# Patient Record
Sex: Female | Born: 1970 | Hispanic: Yes | Marital: Married | Smoking: Never smoker
Health system: Southern US, Community
[De-identification: ages and names within clinical notes are randomized; demographics above are authoritative.]

## PROBLEM LIST (undated history)

## (undated) DIAGNOSIS — K929 Disease of digestive system, unspecified: Secondary | ICD-10-CM

## (undated) DIAGNOSIS — J309 Allergic rhinitis, unspecified: Secondary | ICD-10-CM

## (undated) HISTORY — PX: NO PAST SURGERIES: SHX2092

## (undated) HISTORY — DX: Disease of digestive system, unspecified: K92.9

## (undated) HISTORY — DX: Allergic rhinitis, unspecified: J30.9

---

## 2012-10-26 ENCOUNTER — Other Ambulatory Visit: Payer: Self-pay | Admitting: Gastroenterology

## 2012-10-26 DIAGNOSIS — R109 Unspecified abdominal pain: Secondary | ICD-10-CM

## 2012-10-28 ENCOUNTER — Ambulatory Visit
Admission: RE | Admit: 2012-10-28 | Discharge: 2012-10-28 | Disposition: A | Discharge status: Home / Self Care | Payer: BC Managed Care – PPO | Source: Ambulatory Visit | Attending: Gastroenterology | Admitting: Gastroenterology

## 2012-10-28 DIAGNOSIS — R109 Unspecified abdominal pain: Secondary | ICD-10-CM

## 2016-01-16 DIAGNOSIS — J0191 Acute recurrent sinusitis, unspecified: Secondary | ICD-10-CM | POA: Diagnosis not present

## 2016-02-12 DIAGNOSIS — N760 Acute vaginitis: Secondary | ICD-10-CM | POA: Diagnosis not present

## 2016-02-12 DIAGNOSIS — B9689 Other specified bacterial agents as the cause of diseases classified elsewhere: Secondary | ICD-10-CM | POA: Diagnosis not present

## 2016-07-10 DIAGNOSIS — E611 Iron deficiency: Secondary | ICD-10-CM | POA: Diagnosis not present

## 2016-07-10 DIAGNOSIS — Z0000 Encounter for general adult medical examination without abnormal findings: Secondary | ICD-10-CM | POA: Diagnosis not present

## 2016-07-10 DIAGNOSIS — Z23 Encounter for immunization: Secondary | ICD-10-CM | POA: Diagnosis not present

## 2016-07-10 DIAGNOSIS — R739 Hyperglycemia, unspecified: Secondary | ICD-10-CM | POA: Diagnosis not present

## 2016-07-10 DIAGNOSIS — E559 Vitamin D deficiency, unspecified: Secondary | ICD-10-CM | POA: Diagnosis not present

## 2016-07-10 DIAGNOSIS — K219 Gastro-esophageal reflux disease without esophagitis: Secondary | ICD-10-CM | POA: Diagnosis not present

## 2016-10-14 DIAGNOSIS — Z1389 Encounter for screening for other disorder: Secondary | ICD-10-CM | POA: Diagnosis not present

## 2016-10-14 DIAGNOSIS — Z124 Encounter for screening for malignant neoplasm of cervix: Secondary | ICD-10-CM | POA: Diagnosis not present

## 2016-10-14 DIAGNOSIS — Z01419 Encounter for gynecological examination (general) (routine) without abnormal findings: Secondary | ICD-10-CM | POA: Diagnosis not present

## 2017-02-12 ENCOUNTER — Other Ambulatory Visit: Payer: Self-pay | Admitting: Obstetrics and Gynecology

## 2017-02-12 DIAGNOSIS — Z1231 Encounter for screening mammogram for malignant neoplasm of breast: Secondary | ICD-10-CM

## 2017-02-24 ENCOUNTER — Encounter: Payer: Self-pay | Admitting: Radiology

## 2017-02-24 ENCOUNTER — Ambulatory Visit
Admission: RE | Admit: 2017-02-24 | Discharge: 2017-02-24 | Disposition: A | Discharge status: Home / Self Care | Payer: BLUE CROSS/BLUE SHIELD | Source: Ambulatory Visit | Attending: Obstetrics and Gynecology | Admitting: Obstetrics and Gynecology

## 2017-02-24 DIAGNOSIS — Z1231 Encounter for screening mammogram for malignant neoplasm of breast: Secondary | ICD-10-CM | POA: Insufficient documentation

## 2017-02-24 DIAGNOSIS — R928 Other abnormal and inconclusive findings on diagnostic imaging of breast: Secondary | ICD-10-CM | POA: Diagnosis not present

## 2017-02-25 ENCOUNTER — Other Ambulatory Visit: Payer: Self-pay | Admitting: *Deleted

## 2017-02-25 ENCOUNTER — Inpatient Hospital Stay
Admission: RE | Admit: 2017-02-25 | Discharge: 2017-02-25 | Disposition: A | Discharge status: Home / Self Care | Payer: Self-pay | Source: Ambulatory Visit | Attending: *Deleted | Admitting: *Deleted

## 2017-02-25 DIAGNOSIS — Z9289 Personal history of other medical treatment: Secondary | ICD-10-CM

## 2017-03-01 ENCOUNTER — Other Ambulatory Visit: Payer: Self-pay | Admitting: Obstetrics and Gynecology

## 2017-03-01 DIAGNOSIS — R928 Other abnormal and inconclusive findings on diagnostic imaging of breast: Secondary | ICD-10-CM

## 2017-03-01 DIAGNOSIS — N6320 Unspecified lump in the left breast, unspecified quadrant: Secondary | ICD-10-CM

## 2017-03-04 ENCOUNTER — Ambulatory Visit
Admission: RE | Admit: 2017-03-04 | Discharge: 2017-03-04 | Disposition: A | Discharge status: Home / Self Care | Payer: BLUE CROSS/BLUE SHIELD | Source: Ambulatory Visit | Attending: Obstetrics and Gynecology | Admitting: Obstetrics and Gynecology

## 2017-03-04 DIAGNOSIS — N6489 Other specified disorders of breast: Secondary | ICD-10-CM | POA: Insufficient documentation

## 2017-03-04 DIAGNOSIS — N6320 Unspecified lump in the left breast, unspecified quadrant: Secondary | ICD-10-CM | POA: Diagnosis not present

## 2017-03-04 DIAGNOSIS — R928 Other abnormal and inconclusive findings on diagnostic imaging of breast: Secondary | ICD-10-CM

## 2017-03-04 DIAGNOSIS — N6002 Solitary cyst of left breast: Secondary | ICD-10-CM | POA: Insufficient documentation

## 2017-03-05 ENCOUNTER — Other Ambulatory Visit: Payer: Self-pay | Admitting: Obstetrics and Gynecology

## 2017-03-05 DIAGNOSIS — N6342 Unspecified lump in left breast, subareolar: Secondary | ICD-10-CM | POA: Insufficient documentation

## 2017-03-11 ENCOUNTER — Other Ambulatory Visit: Payer: Self-pay | Admitting: Obstetrics and Gynecology

## 2017-03-11 DIAGNOSIS — N6342 Unspecified lump in left breast, subareolar: Secondary | ICD-10-CM

## 2017-07-22 DIAGNOSIS — E559 Vitamin D deficiency, unspecified: Secondary | ICD-10-CM | POA: Diagnosis not present

## 2017-07-22 DIAGNOSIS — Z136 Encounter for screening for cardiovascular disorders: Secondary | ICD-10-CM | POA: Diagnosis not present

## 2017-07-22 DIAGNOSIS — K219 Gastro-esophageal reflux disease without esophagitis: Secondary | ICD-10-CM | POA: Diagnosis not present

## 2017-07-22 DIAGNOSIS — Z79899 Other long term (current) drug therapy: Secondary | ICD-10-CM | POA: Diagnosis not present

## 2017-07-22 DIAGNOSIS — J069 Acute upper respiratory infection, unspecified: Secondary | ICD-10-CM | POA: Diagnosis not present

## 2017-07-22 DIAGNOSIS — E611 Iron deficiency: Secondary | ICD-10-CM | POA: Diagnosis not present

## 2017-07-22 DIAGNOSIS — Z0000 Encounter for general adult medical examination without abnormal findings: Secondary | ICD-10-CM | POA: Diagnosis not present

## 2017-07-22 DIAGNOSIS — R739 Hyperglycemia, unspecified: Secondary | ICD-10-CM | POA: Diagnosis not present

## 2017-09-06 ENCOUNTER — Ambulatory Visit
Admission: RE | Admit: 2017-09-06 | Discharge: 2017-09-06 | Disposition: A | Discharge status: Home / Self Care | Payer: BLUE CROSS/BLUE SHIELD | Source: Ambulatory Visit | Attending: Obstetrics and Gynecology | Admitting: Obstetrics and Gynecology

## 2017-09-06 DIAGNOSIS — N6342 Unspecified lump in left breast, subareolar: Secondary | ICD-10-CM

## 2017-09-06 DIAGNOSIS — R928 Other abnormal and inconclusive findings on diagnostic imaging of breast: Secondary | ICD-10-CM | POA: Diagnosis not present

## 2017-09-06 DIAGNOSIS — N6489 Other specified disorders of breast: Secondary | ICD-10-CM | POA: Diagnosis not present

## 2017-09-13 DIAGNOSIS — T1511XA Foreign body in conjunctival sac, right eye, initial encounter: Secondary | ICD-10-CM | POA: Diagnosis not present

## 2017-11-16 DIAGNOSIS — K219 Gastro-esophageal reflux disease without esophagitis: Secondary | ICD-10-CM | POA: Diagnosis not present

## 2018-05-25 ENCOUNTER — Other Ambulatory Visit: Payer: Self-pay | Admitting: Internal Medicine

## 2018-05-25 DIAGNOSIS — Z1231 Encounter for screening mammogram for malignant neoplasm of breast: Secondary | ICD-10-CM

## 2018-06-15 ENCOUNTER — Ambulatory Visit
Admission: RE | Admit: 2018-06-15 | Discharge: 2018-06-15 | Disposition: A | Discharge status: Home / Self Care | Payer: BLUE CROSS/BLUE SHIELD | Source: Ambulatory Visit | Attending: Internal Medicine | Admitting: Internal Medicine

## 2018-06-15 DIAGNOSIS — Z1231 Encounter for screening mammogram for malignant neoplasm of breast: Secondary | ICD-10-CM | POA: Diagnosis not present

## 2019-06-16 ENCOUNTER — Other Ambulatory Visit: Payer: Self-pay | Admitting: Internal Medicine

## 2019-06-16 DIAGNOSIS — Z1231 Encounter for screening mammogram for malignant neoplasm of breast: Secondary | ICD-10-CM

## 2019-07-25 ENCOUNTER — Ambulatory Visit
Admission: RE | Admit: 2019-07-25 | Discharge: 2019-07-25 | Disposition: A | Discharge status: Home / Self Care | Payer: 59 | Source: Ambulatory Visit | Attending: Internal Medicine | Admitting: Internal Medicine

## 2019-07-25 DIAGNOSIS — Z1231 Encounter for screening mammogram for malignant neoplasm of breast: Secondary | ICD-10-CM | POA: Diagnosis present

## 2019-09-27 ENCOUNTER — Other Ambulatory Visit: Payer: Self-pay

## 2019-09-27 ENCOUNTER — Encounter: Payer: Self-pay | Admitting: Obstetrics and Gynecology

## 2019-09-27 ENCOUNTER — Ambulatory Visit (INDEPENDENT_AMBULATORY_CARE_PROVIDER_SITE_OTHER): Payer: Managed Care, Other (non HMO) | Admitting: Obstetrics and Gynecology

## 2019-09-27 VITALS — BP 120/80 | Ht 64.0 in | Wt 148.0 lb

## 2019-09-27 DIAGNOSIS — N760 Acute vaginitis: Secondary | ICD-10-CM | POA: Diagnosis not present

## 2019-09-27 DIAGNOSIS — N898 Other specified noninflammatory disorders of vagina: Secondary | ICD-10-CM | POA: Diagnosis not present

## 2019-09-27 DIAGNOSIS — B9689 Other specified bacterial agents as the cause of diseases classified elsewhere: Secondary | ICD-10-CM | POA: Diagnosis not present

## 2019-09-27 LAB — POCT WET PREP WITH KOH
Clue Cells Wet Prep HPF POC: NEGATIVE
KOH Prep POC: NEGATIVE
Trichomonas, UA: NEGATIVE
Yeast Wet Prep HPF POC: NEGATIVE

## 2019-09-27 MED ORDER — METRONIDAZOLE 0.75 % VA GEL: 1.0000 | Freq: Every day | VAGINAL | 0 refills | Status: AC

## 2019-09-27 NOTE — Patient Instructions (Signed)
I value your feedback and entrusting us with your care. If you get a [facility] patient survey, I would appreciate you taking the time to let us know about your experience today. Thank you!  As of September 21, 2019, your lab results will be released to your MyChart immediately, before I even have a chance to see them. Please give me time to review them and contact you if there are any abnormalities. Thank you for your patience.  

## 2019-09-27 NOTE — Progress Notes (Signed)
Patient, No Pcp Per   Chief Complaint  Patient presents with  . Vaginal Discharge    abnormal odor, itching and little irritation x weeks    HPI:      [name] is a 48 y.o. No obstetric history on file. who LMP was No LMP recorded. Patient is postmenopausal., presents today for increased vaginal d/c with non-fishy odor, irritation, mild pelvic discomfort for a few wks. Hx of BV in past and used leftover cream to treat. Sx feel the same. Did abx a few months ago. No urin sx, fevers.  She is not sex active. No longer having menses.  Last pap 2018.  Patient Active Problem List   Diagnosis Date Noted  . Bacterial vaginosis 09/27/2019  . Subareolar mass of left breast 03/05/2017    History reviewed. No pertinent surgical history.  Family History  Problem Relation Age of Onset  . Diabetes Mother   . Heart disease Father   . Hypertension Father   . Transient ischemic attack Father   . Breast cancer Neg Hx     Social History   Socioeconomic History  . Marital status: Married    Spouse name: Not on file  . Number of children: Not on file  . Years of education: Not on file  . Highest education level: Not on file  Occupational History  . Not on file  Tobacco Use  . Smoking status: Never Smoker  . Smokeless tobacco: Never Used  Substance and Sexual Activity  . Alcohol use: Not Currently  . Drug use: Never  . Sexual activity: Not Currently    Birth control/protection: None  Other Topics Concern  . Not on file  Social History Narrative  . Not on file   Social Determinants of Health   Financial Resource Strain:   . Difficulty of Paying Living Expenses: Not on file  Food Insecurity:   . Worried About Charity fundraiser in the Last Year: Not on file  . Ran Out of Food in the Last Year: Not on file  Transportation Needs:   . Lack of Transportation (Medical): Not on file  . Lack of Transportation (Non-Medical): Not on file  Physical Activity:   . Days of  Exercise per Week: Not on file  . Minutes of Exercise per Session: Not on file  Stress:   . Feeling of Stress : Not on file  Social Connections:   . Frequency of Communication with Friends and Family: Not on file  . Frequency of Social Gatherings with Friends and Family: Not on file  . Attends Religious Services: Not on file  . Active Member of Clubs or Organizations: Not on file  . Attends Archivist Meetings: Not on file  . Marital Status: Not on file  Intimate Partner Violence:   . Fear of Current or Ex-Partner: Not on file  . Emotionally Abused: Not on file  . Physically Abused: Not on file  . Sexually Abused: Not on file    Outpatient Medications Prior to Visit  Medication Sig Dispense Refill  . pantoprazole (PROTONIX) 40 MG tablet      No facility-administered medications prior to visit.      ROS:  Review of Systems  Constitutional: Negative for fever.  Gastrointestinal: Negative for blood in stool, constipation, diarrhea, nausea and vomiting.  Genitourinary: Positive for pelvic pain and vaginal discharge. Negative for dyspareunia, dysuria, flank pain, frequency, hematuria, urgency, vaginal bleeding and vaginal pain.  Musculoskeletal: Negative for  back pain.  Skin: Negative for rash.  BREAST: No symptoms   OBJECTIVE:   Vitals:  BP 120/80   Ht 5\' 4"  (1.626 m)   Wt 148 lb (67.1 kg)   BMI 25.40 kg/m   Physical Exam Vitals reviewed.  Constitutional:      Appearance: She is well-developed.  Pulmonary:     Effort: Pulmonary effort is normal.  Genitourinary:    General: Normal vulva.     Pubic Area: No rash.      Labia:        Right: No rash, tenderness or lesion.        Left: No rash, tenderness or lesion.      Vagina: Normal. No vaginal discharge, erythema or tenderness.     Cervix: Normal.     Uterus: Normal. Not enlarged and not tender.      Adnexa: Right adnexa normal and left adnexa normal.       Right: No mass or tenderness.          Left: No mass or tenderness.    Musculoskeletal:        General: Normal range of motion.     Cervical back: Normal range of motion.  Skin:    General: Skin is warm and dry.  Neurological:     General: No focal deficit present.     Mental Status: She is alert and oriented to person, place, and time.  Psychiatric:        Mood and Affect: Mood normal.        Behavior: Behavior normal.        Thought Content: Thought content normal.        Judgment: Judgment normal.     Results: Results for orders placed or performed in visit on 09/27/19 (from the past 24 hour(s))  POCT Wet Prep with KOH     Status: Normal   Collection Time: 09/27/19 12:07 PM  Result Value Ref Range   Trichomonas, UA Negative    Clue Cells Wet Prep HPF POC neg    Epithelial Wet Prep HPF POC     Yeast Wet Prep HPF POC neg    Bacteria Wet Prep HPF POC     RBC Wet Prep HPF POC     WBC Wet Prep HPF POC     KOH Prep POC Negative Negative     Assessment/Plan: Vaginal discharge - Plan: POCT Wet Prep with KOH; neg wet prep/pos sx. Already treated with leftover metrogel. Most likely BV by clinical sx/hx. Treat empirically with Rx metrogel. Will RF if sx recur. If sx persist, will check culture. F/u prn.  Bacterial vaginosis - Plan: metroNIDAZOLE (METROGEL) 0.75 % vaginal gel    Meds ordered this encounter  Medications  . metroNIDAZOLE (METROGEL) 0.75 % vaginal gel    Sig: Place 1 Applicatorful vaginally at bedtime for 5 days.    Dispense:  50 g    Refill:  0    Order Specific Question:   Supervising Provider    Answer:   09/29/19 Nadara Mustard      Return if symptoms worsen or fail to improve.  Anna Beaird B. Freyja Govea, PA-C 09/27/2019 12:09 PM

## 2019-11-09 DIAGNOSIS — Z13220 Encounter for screening for lipoid disorders: Secondary | ICD-10-CM | POA: Diagnosis not present

## 2019-11-09 DIAGNOSIS — K219 Gastro-esophageal reflux disease without esophagitis: Secondary | ICD-10-CM | POA: Diagnosis not present

## 2019-11-09 DIAGNOSIS — Z0000 Encounter for general adult medical examination without abnormal findings: Secondary | ICD-10-CM | POA: Diagnosis not present

## 2019-11-09 DIAGNOSIS — Z79899 Other long term (current) drug therapy: Secondary | ICD-10-CM | POA: Diagnosis not present

## 2019-11-09 DIAGNOSIS — E559 Vitamin D deficiency, unspecified: Secondary | ICD-10-CM | POA: Diagnosis not present

## 2019-11-09 DIAGNOSIS — E611 Iron deficiency: Secondary | ICD-10-CM | POA: Diagnosis not present

## 2019-11-09 DIAGNOSIS — R739 Hyperglycemia, unspecified: Secondary | ICD-10-CM | POA: Diagnosis not present

## 2019-12-01 DIAGNOSIS — R140 Abdominal distension (gaseous): Secondary | ICD-10-CM | POA: Diagnosis not present

## 2019-12-01 DIAGNOSIS — K219 Gastro-esophageal reflux disease without esophagitis: Secondary | ICD-10-CM | POA: Diagnosis not present

## 2019-12-28 DIAGNOSIS — L282 Other prurigo: Secondary | ICD-10-CM | POA: Diagnosis not present

## 2019-12-28 DIAGNOSIS — Z23 Encounter for immunization: Secondary | ICD-10-CM | POA: Diagnosis not present

## 2020-11-27 ENCOUNTER — Other Ambulatory Visit: Payer: Self-pay | Admitting: Internal Medicine

## 2020-11-27 DIAGNOSIS — Z1231 Encounter for screening mammogram for malignant neoplasm of breast: Secondary | ICD-10-CM

## 2020-12-13 ENCOUNTER — Other Ambulatory Visit: Payer: Self-pay

## 2020-12-13 ENCOUNTER — Ambulatory Visit
Admission: RE | Admit: 2020-12-13 | Discharge: 2020-12-13 | Disposition: A | Discharge status: Home / Self Care | Payer: 59 | Source: Ambulatory Visit | Attending: Internal Medicine | Admitting: Internal Medicine

## 2020-12-13 DIAGNOSIS — Z1231 Encounter for screening mammogram for malignant neoplasm of breast: Secondary | ICD-10-CM

## 2020-12-17 ENCOUNTER — Other Ambulatory Visit: Payer: Self-pay | Admitting: Internal Medicine

## 2020-12-19 ENCOUNTER — Other Ambulatory Visit: Payer: Self-pay | Admitting: Internal Medicine

## 2020-12-19 DIAGNOSIS — N6489 Other specified disorders of breast: Secondary | ICD-10-CM

## 2020-12-19 DIAGNOSIS — R928 Other abnormal and inconclusive findings on diagnostic imaging of breast: Secondary | ICD-10-CM

## 2020-12-25 ENCOUNTER — Ambulatory Visit
Admission: RE | Admit: 2020-12-25 | Discharge: 2020-12-25 | Disposition: A | Discharge status: Home / Self Care | Payer: 59 | Source: Ambulatory Visit | Attending: Internal Medicine | Admitting: Internal Medicine

## 2020-12-25 ENCOUNTER — Other Ambulatory Visit: Payer: Self-pay

## 2020-12-25 DIAGNOSIS — R928 Other abnormal and inconclusive findings on diagnostic imaging of breast: Secondary | ICD-10-CM

## 2020-12-25 DIAGNOSIS — N6489 Other specified disorders of breast: Secondary | ICD-10-CM | POA: Diagnosis present

## 2021-03-24 ENCOUNTER — Ambulatory Visit (INDEPENDENT_AMBULATORY_CARE_PROVIDER_SITE_OTHER): Payer: 59 | Admitting: Obstetrics and Gynecology

## 2021-03-24 ENCOUNTER — Other Ambulatory Visit: Payer: Self-pay

## 2021-03-24 ENCOUNTER — Encounter: Payer: Self-pay | Admitting: Obstetrics and Gynecology

## 2021-03-24 ENCOUNTER — Other Ambulatory Visit (HOSPITAL_COMMUNITY)
Admission: RE | Admit: 2021-03-24 | Discharge: 2021-03-24 | Disposition: A | Discharge status: Home / Self Care | Payer: 59 | Source: Ambulatory Visit | Attending: Obstetrics and Gynecology | Admitting: Obstetrics and Gynecology

## 2021-03-24 VITALS — BP 120/80 | Ht 65.0 in | Wt 142.0 lb

## 2021-03-24 DIAGNOSIS — Z124 Encounter for screening for malignant neoplasm of cervix: Secondary | ICD-10-CM | POA: Diagnosis present

## 2021-03-24 DIAGNOSIS — Z1339 Encounter for screening examination for other mental health and behavioral disorders: Secondary | ICD-10-CM

## 2021-03-24 DIAGNOSIS — Z01419 Encounter for gynecological examination (general) (routine) without abnormal findings: Secondary | ICD-10-CM

## 2021-03-24 DIAGNOSIS — R011 Cardiac murmur, unspecified: Secondary | ICD-10-CM

## 2021-03-24 DIAGNOSIS — Z1331 Encounter for screening for depression: Secondary | ICD-10-CM

## 2021-03-24 NOTE — Progress Notes (Signed)
Gynecology Annual Exam  PCP: Marden Noble, MD  Chief Complaint  Patient presents with   Gynecologic Exam    No concerns   History of Present Illness:  Veronica Mahoney is a 50 y.o. [phone] who LMP was No LMP recorded. Patient is postmenopausal., presents today for her annual examination.  She doesn't remember the last time she has period was at age 50 years.    She does not have vasomotor sx.   She is not sexually active.   Last Pap: 2018  Results were: no abnormalities /neg HPV DNA.  Hx of STDs: none  Last mammogram: 2022  Results were: BiRads 2 - follow up 1 year There is no FH of breast cancer. There is no FH of ovarian cancer. The patient does not do self-breast exams.  Colonoscopy: never had DEXA: has not been screened for osteoporosis  Tobacco use: The patient denies current or previous tobacco use. Alcohol use: social drinker Exercise: yes  The patient wears seatbelts: yes.     Past Medical History:  Diagnosis Date   Allergic rhinitis    Gastrointestinal disorder     Past Surgical History:  Procedure Laterality Date   NO PAST SURGERIES      Prior to Admission medications   Medication Sig Start Date End Date Taking? Authorizing Provider  hydrocortisone 2.5 % cream Apply topically 2 (two) times daily. 03/05/21  Yes [provider]  pantoprazole (PROTONIX) 40 MG tablet  07/03/14  Yes [provider]  VITAMIN D PO Take by mouth.   Yes [provider]    Allergies  Allergen Reactions   Other Swelling    Seafood- shortness of breath   Penicillins Rash   Sulfa Antibiotics Rash    Obstetric History: [id number]  Family History  Problem Relation Age of Onset   Diabetes Mother    Heart disease Father    Hypertension Father    Transient ischemic attack Father    Breast cancer Neg Hx     Social History   Socioeconomic History   Marital status: Married    Spouse name: Not on file   Number of children: Not on file   Years of  education: Not on file   Highest education level: Not on file  Occupational History   Not on file  Tobacco Use   Smoking status: Never   Smokeless tobacco: Never  Vaping Use   Vaping Use: Never used  Substance and Sexual Activity   Alcohol use: Not Currently   Drug use: Never   Sexual activity: Not Currently    Birth control/protection: None  Other Topics Concern   Not on file  Social History Narrative   Not on file   Social Determinants of Health   Financial Resource Strain: Not on file  Food Insecurity: Not on file  Transportation Needs: Not on file  Physical Activity: Not on file  Stress: Not on file  Social Connections: Not on file  Intimate Partner Violence: Not on file    Review of Systems  Constitutional: Negative.   HENT: Negative.    Eyes: Negative.   Respiratory: Negative.    Cardiovascular: Negative.   Gastrointestinal: Negative.   Genitourinary: Negative.   Musculoskeletal: Negative.   Skin: Negative.   Neurological: Negative.   Psychiatric/Behavioral: Negative.      Physical Exam BP 120/80   Ht 5\' 5"  (1.651 m)   Wt 142 lb (64.4 kg)   BMI 23.63 kg/m   Physical Exam Constitutional:  General: She is not in acute distress.    Appearance: Normal appearance. She is well-developed.  Genitourinary:     Vulva and bladder normal.     Right Labia: No rash, tenderness, lesions, skin changes or Bartholin's cyst.    Left Labia: No tenderness, lesions, skin changes, Bartholin's cyst or rash.    No inguinal adenopathy present in the right or left side.    Pelvic Tanner Score: 5/5.    No vaginal discharge, erythema, tenderness or bleeding.      Right Adnexa: not tender, not full and no mass present.    Left Adnexa: not tender, not full and no mass present.    No cervical motion tenderness, discharge, lesion or polyp.     Uterus is not enlarged or tender.     No uterine mass detected.    Pelvic exam was performed with patient in the lithotomy position.   Breasts:    Right: No inverted nipple, mass, nipple discharge, skin change or tenderness.     Left: No inverted nipple, mass, nipple discharge, skin change or tenderness.  HENT:     Head: Normocephalic and atraumatic.  Eyes:     General: No scleral icterus.    Conjunctiva/sclera: Conjunctivae normal.  Neck:     Thyroid: No thyromegaly.  Cardiovascular:     Rate and Rhythm: Normal rate and regular rhythm.     Heart sounds: Murmur (SEM II/VI over 2nd right interspace) heard.    No friction rub. No gallop.  Pulmonary:     Effort: Pulmonary effort is normal. No respiratory distress.     Breath sounds: Normal breath sounds. No wheezing or rales.  Abdominal:     General: Bowel sounds are normal. There is no distension.     Palpations: Abdomen is soft. There is no mass.     Tenderness: There is no abdominal tenderness. There is no guarding or rebound.     Hernia: There is no hernia in the left inguinal area or right inguinal area.  Musculoskeletal:        General: No swelling or tenderness. Normal range of motion.     Cervical back: Normal range of motion and neck supple.  Lymphadenopathy:     Cervical: No cervical adenopathy.     Lower Body: No right inguinal adenopathy. No left inguinal adenopathy.  Neurological:     General: No focal deficit present.     Mental Status: She is alert and oriented to person, place, and time.     Cranial Nerves: No cranial nerve deficit.  Skin:    General: Skin is warm and dry.     Findings: No erythema or rash.  Psychiatric:        Mood and Affect: Mood normal.        Behavior: Behavior normal.        Judgment: Judgment normal.    Female chaperone present for pelvic and breast  portions of the physical exam  Results: AUDIT Questionnaire (screen for alcoholism): 3 PHQ-9: 0  Assessment: 50 y.o. [id number] female here for routine gynecologic examination.  Plan: Problem List Items Addressed This Visit   None Visit Diagnoses     Women's  annual routine gynecological examination    -  Primary   Relevant Orders   Cytology - PAP   Screening for depression       Screening for alcoholism       Pap smear for cervical cancer screening       Relevant  Orders   Cytology - PAP   Heart murmur           Screening: -- Blood pressure screen normal -- Colonoscopy -  due. Needs to discuss with her new PCP -- Mammogram - not due -- Weight screening: normal -- Depression screening negative (PHQ-9) -- Nutrition: normal -- cholesterol screening: per PCP -- osteoporosis screening: not due -- tobacco screening: not using -- alcohol screening: AUDIT questionnaire indicates low-risk usage. -- family history of breast cancer screening: done. not at high risk. -- no evidence of domestic violence or intimate partner violence. -- STD screening: gonorrhea/chlamydia NAAT not collected per patient request. -- pap smear collected per ASCCP guidelines  Heart murmur: I asked that she discuss with her PCP (She is moving to [place], [place], in a couple weeks).  It is a very mild heart murmur and she is asymptomatic.   Thomasene Mohair, MD 03/24/2021 2:09 PM

## 2021-03-25 ENCOUNTER — Encounter: Payer: Self-pay | Admitting: Obstetrics and Gynecology

## 2021-03-28 LAB — CYTOLOGY - PAP
Comment: NEGATIVE
Comment: NEGATIVE
Diagnosis: HIGH — AB
HPV 16: POSITIVE — AB
HPV 18 / 45: NEGATIVE
High risk HPV: POSITIVE — AB

## 2021-04-15 ENCOUNTER — Telehealth: Payer: Self-pay | Admitting: Obstetrics and Gynecology

## 2021-04-15 DIAGNOSIS — R87613 High grade squamous intraepithelial lesion on cytologic smear of cervix (HGSIL): Secondary | ICD-10-CM

## 2021-04-15 NOTE — Telephone Encounter (Signed)
[employer] call: Reviewed pap smear results with a medical spanish interpreter.  Discussed high grade pre-cancerous lesion.  Options include colposcopy vs LEEP.  She elects LEEP.   All questions answered. Will schedule LEEP.

## 2021-04-18 ENCOUNTER — Telehealth: Payer: Self-pay

## 2021-04-18 NOTE — Telephone Encounter (Signed)
Called patient with [facility] interpreters [name] [phone]  She has been scheduled for an in office LEEP procedure with Jean Rosenthal on 8/4 @ 8:50. Advised to arr 15 min prior to appt.

## 2021-04-18 NOTE — Telephone Encounter (Signed)
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

## 2021-04-22 NOTE — Telephone Encounter (Signed)
Noted  

## 2021-05-15 ENCOUNTER — Other Ambulatory Visit (HOSPITAL_COMMUNITY)
Admission: RE | Admit: 2021-05-15 | Discharge: 2021-05-15 | Disposition: A | Discharge status: Home / Self Care | Payer: 59 | Source: Ambulatory Visit | Attending: Obstetrics and Gynecology | Admitting: Obstetrics and Gynecology

## 2021-05-15 ENCOUNTER — Other Ambulatory Visit: Payer: Self-pay

## 2021-05-15 ENCOUNTER — Ambulatory Visit (INDEPENDENT_AMBULATORY_CARE_PROVIDER_SITE_OTHER): Payer: 59 | Admitting: Obstetrics and Gynecology

## 2021-05-15 ENCOUNTER — Encounter: Payer: Self-pay | Admitting: Obstetrics and Gynecology

## 2021-05-15 VITALS — BP 128/80 | HR 82 | Ht 65.0 in | Wt 139.0 lb

## 2021-05-15 DIAGNOSIS — R87613 High grade squamous intraepithelial lesion on cytologic smear of cervix (HGSIL): Secondary | ICD-10-CM | POA: Diagnosis present

## 2021-05-15 NOTE — Progress Notes (Signed)
  LEEP PROCEDURE NOTE  The LEEP has been explained to the patient in detail; risks/benefits reviewed.  The risks include, but are not limited to, bleeding, infection, and the possibility of cervical stenosis or cervical incompetence.  The patient had previously been given information regarding abnormal PAP smears and their relationship to HPV.  We have discussed the natural course and history of HPV, the possibility of incomplete treatment by the LEEP, as well as the possibility of recurrence.  I have reviewed the consent form for LEEP with her, and she fully understands its contents.  We have discussed the procedure itself. I have informed her that following the LEEP she should refrain from intercourse and the use of tampons for three weeks, and that she should also expect some spotting and brown/black discharge over the next several days.  We have discussed the fact that vaginal bleeding, differentiated from spotting, is not normal and that if she should have this complication, she should contact me immediately.  The follow-up after LEEP will be PAP smears or viral typing performed at regular intervals for up to 3-5 years.  Should these all prove to be normal, she will then be back on typical cervical screening.  I have answered all of her questions, and I believe she has an adequate understanding of the LEEP, its implications, and the necessity of follow-up care.  I discussed her colpo results and explained the procedure of LEEP.  All questions were answered and she signed the consent form.    LEEP performed in the usual manner after reviewing the previous colpo findings and results. The colposcope was used to gain better visualization of the cervix throughout the procedure and was necessary to gain access to margins.  Lugol's solution was usesd to identify any abnormal areas of the cervix.  The cervix was cleansed with betadine solution. Local injection of Lidocaine was performed for  anesthesia. Ectocervical and then endocervical specimens obtained using the loop electrodes without difficulty.  It was labeled accordingly. An ECC was obtained.  The base and edges of the defect was then cauterized using coagulation current. Monsel's solution was applied to ensure ongoing hemostasis.   The patient tolerated the procedure well.   Thomasene Mohair, MD 05/15/2021 10:09 AM

## 2021-05-20 LAB — SURGICAL PATHOLOGY

## 2021-06-13 ENCOUNTER — Encounter: Payer: Self-pay | Admitting: Obstetrics and Gynecology

## 2021-06-13 ENCOUNTER — Other Ambulatory Visit: Payer: Self-pay

## 2021-06-13 ENCOUNTER — Ambulatory Visit (INDEPENDENT_AMBULATORY_CARE_PROVIDER_SITE_OTHER): Payer: 59 | Admitting: Obstetrics and Gynecology

## 2021-06-13 VITALS — BP 122/70 | Ht 64.0 in | Wt 136.0 lb

## 2021-06-13 DIAGNOSIS — Z09 Encounter for follow-up examination after completed treatment for conditions other than malignant neoplasm: Secondary | ICD-10-CM

## 2021-06-13 DIAGNOSIS — R87613 High grade squamous intraepithelial lesion on cytologic smear of cervix (HGSIL): Secondary | ICD-10-CM

## 2021-06-13 DIAGNOSIS — N871 Moderate cervical dysplasia: Secondary | ICD-10-CM

## 2021-06-13 NOTE — Progress Notes (Signed)
   Postoperative Follow-up Patient presents post op from LEEP 4 weeks ago for HSIL  Subjective: Patient reports some improvement in her preop symptoms. Eating a regular diet without difficulty. The patient is not having any pain.  Activity: normal activities of daily living.  She denies fever, chills, nausea, and vomiting. She has has some prolonged bleeding. But, no cramps or clots.   FINAL MICROSCOPIC DIAGNOSIS:   A. ECTOCERVIX, LEEP:  - Low-grade and high-grade squamous intraepithelial lesion, CIN-1 and  CIN-2.  - Margins not involved.   B. ENDOCERVIX, LEEP:  - Benign endocervical mucosa.  - No dysplasia or malignancy.   C. ENDOCERVIX, CURETTAGE:  - Minute detached dysplastic squamous fragment.  - Scanty benign endocervical mucosa.  - See comment.   Objective: Vital Signs: BP 122/70   Ht 5\' 4"  (1.626 m)   Wt 136 lb (61.7 kg)   BMI 23.34 kg/m  Physical Exam Constitutional:      General: She is not in acute distress.    Appearance: Normal appearance.  Genitourinary:     Bladder and urethral meatus normal.     No lesions in the vagina.     Right Labia: No rash, tenderness, lesions or skin changes.    Left Labia: No tenderness, lesions, skin changes or rash.    No inguinal adenopathy present in the right or left side.    Pelvic Tanner Score: 5/5.    No vaginal discharge, erythema, bleeding or ulceration.     No vaginal prolapse present.     Right Adnexa: not tender, not full and no mass present.    Left Adnexa: not tender, not full and no mass present.    No cervical motion tenderness, discharge, friability, lesion or polyp.     Cervical exam comments: Mild stenosis of cervix. Otherwise, well healed.     Uterus is not enlarged, fixed or tender.     Uterus is anteverted.  HENT:     Head: Normocephalic and atraumatic.  Eyes:     General: No scleral icterus.    Conjunctiva/sclera: Conjunctivae normal.  Lymphadenopathy:     Lower Body: No right inguinal  adenopathy. No left inguinal adenopathy.  Neurological:     General: No focal deficit present.     Mental Status: She is alert and oriented to person, place, and time.     Cranial Nerves: No cranial nerve deficit.  Psychiatric:        Mood and Affect: Mood normal.        Behavior: Behavior normal.        Judgment: Judgment normal.     Assessment: 50 y.o. s/p LEEP progressing well  Plan: Patient has done well after surgery with no apparent complications.  I have discussed the post-operative course to date, and the expected progress moving forward.  The patient understands what complications to be concerned about.  I will see the patient in routine follow up, or sooner if needed.    Activity plan: No restriction.  Discussed cervical stenosis. If bleeding remains a problem, can try to dilate cervix.  Recommend follow up pap smear in six months, given her results.   44, MD  06/13/2021, 10:19 AM

## 2021-10-17 DIAGNOSIS — Z03818 Encounter for observation for suspected exposure to other biological agents ruled out: Secondary | ICD-10-CM | POA: Diagnosis not present

## 2021-10-17 DIAGNOSIS — Z20822 Contact with and (suspected) exposure to covid-19: Secondary | ICD-10-CM | POA: Diagnosis not present

## 2021-10-17 DIAGNOSIS — J029 Acute pharyngitis, unspecified: Secondary | ICD-10-CM | POA: Diagnosis not present

## 2021-12-01 DIAGNOSIS — L299 Pruritus, unspecified: Secondary | ICD-10-CM | POA: Diagnosis not present

## 2021-12-01 DIAGNOSIS — T7802XA Anaphylactic reaction due to shellfish (crustaceans), initial encounter: Secondary | ICD-10-CM | POA: Diagnosis not present

## 2021-12-01 DIAGNOSIS — T781XXA Other adverse food reactions, not elsewhere classified, initial encounter: Secondary | ICD-10-CM | POA: Diagnosis not present

## 2021-12-01 DIAGNOSIS — L539 Erythematous condition, unspecified: Secondary | ICD-10-CM | POA: Diagnosis not present

## 2021-12-01 DIAGNOSIS — X58XXXA Exposure to other specified factors, initial encounter: Secondary | ICD-10-CM | POA: Diagnosis not present

## 2021-12-01 DIAGNOSIS — T782XXA Anaphylactic shock, unspecified, initial encounter: Secondary | ICD-10-CM | POA: Diagnosis not present

## 2021-12-02 DIAGNOSIS — T782XXA Anaphylactic shock, unspecified, initial encounter: Secondary | ICD-10-CM | POA: Diagnosis not present

## 2021-12-20 IMAGING — MG DIGITAL DIAGNOSTIC BILAT W/ TOMO W/ CAD
8 series · 8 of 24 positions shown · non-contrast
Comparison: Previous exams including recent screening mammogram
dated 12/13/2020.

CLINICAL DATA: Patient returns today to evaluate possible bilateral
breast asymmetries identified on recent screening mammogram.



[R MLO synth-2D]
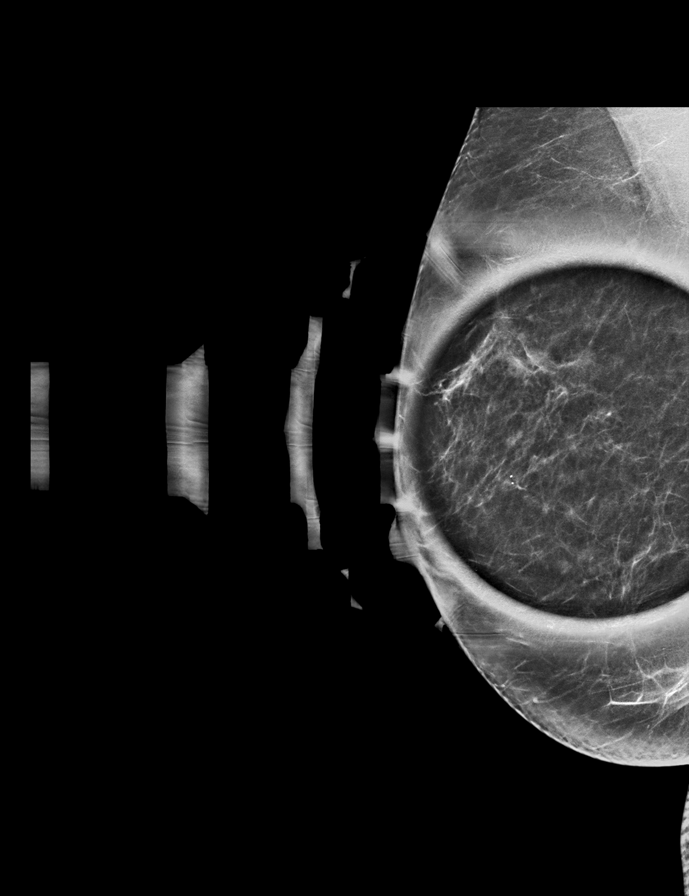

[L MLO synth-2D]
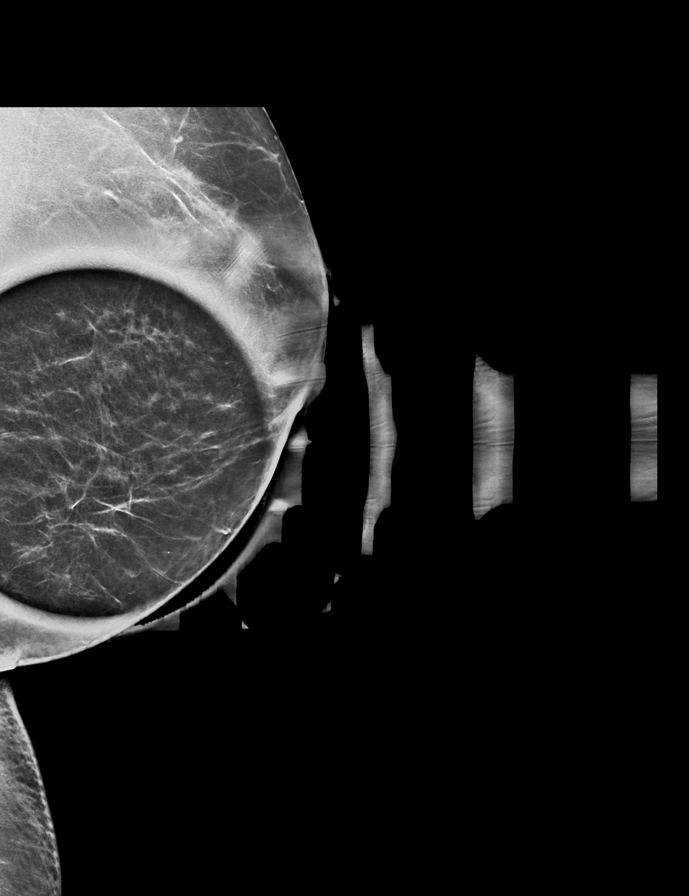

[R ML synth-2D]
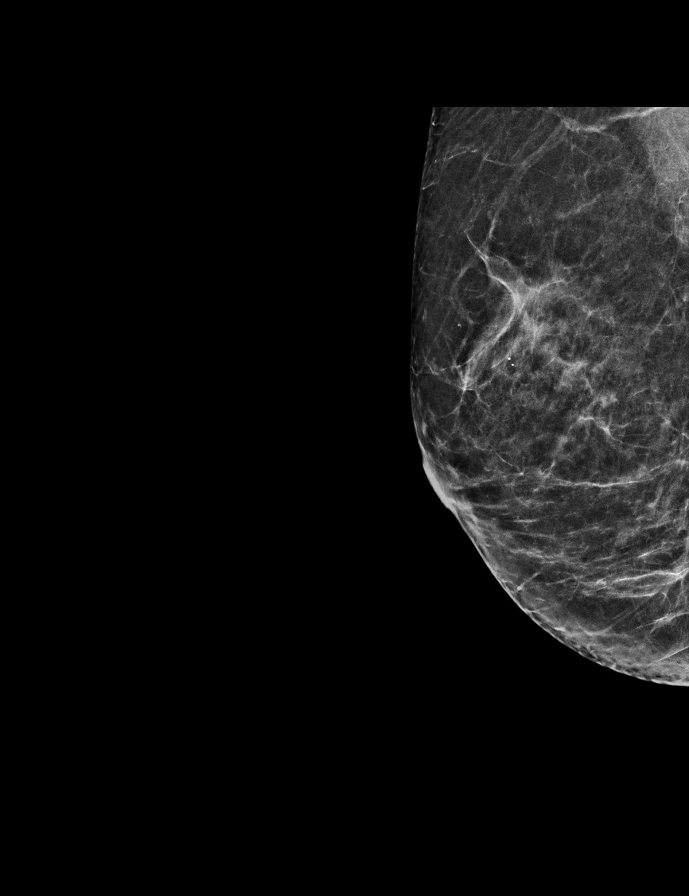

[L ML synth-2D]
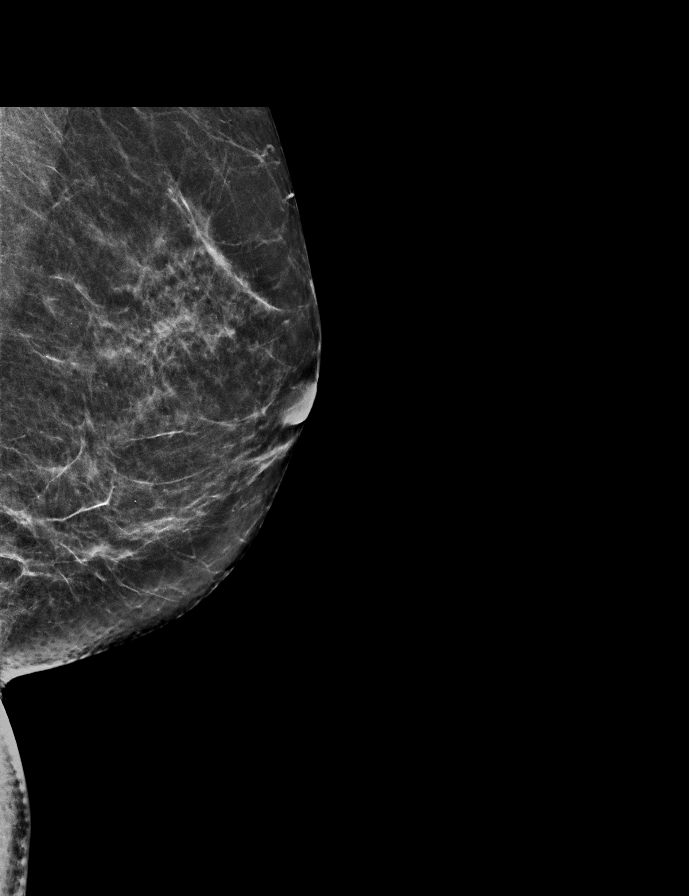

[R MLO tomo · tomo slice 30/59.0]
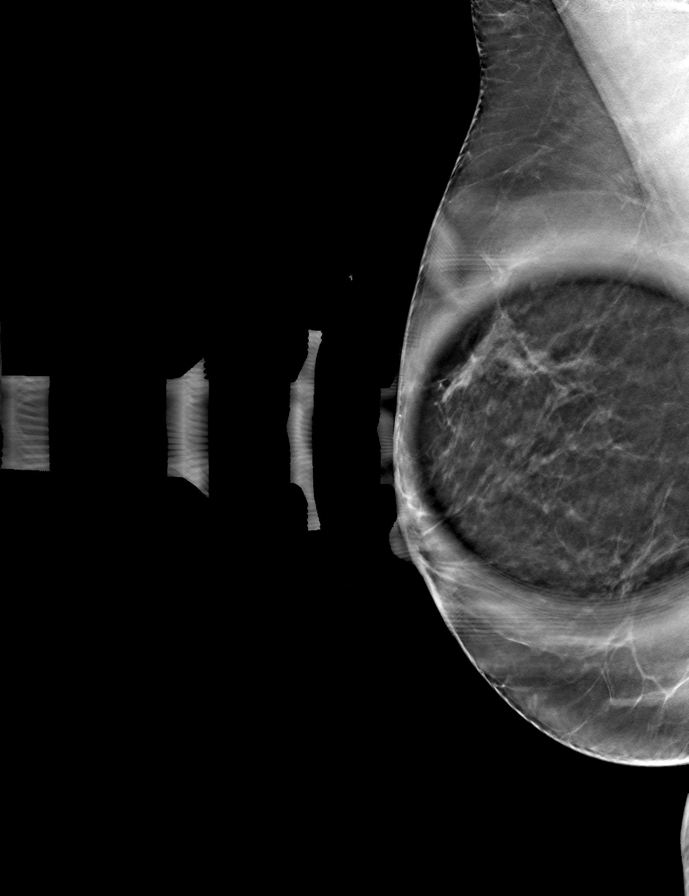

[L ML tomo · tomo slice 29/58.0]
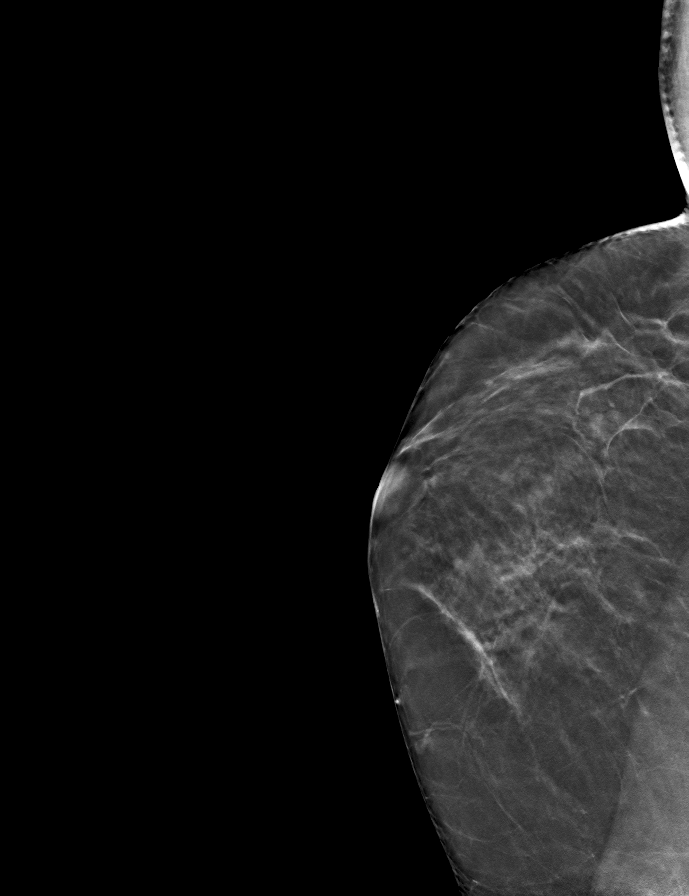

[L MLO tomo · tomo slice 27/52.0]
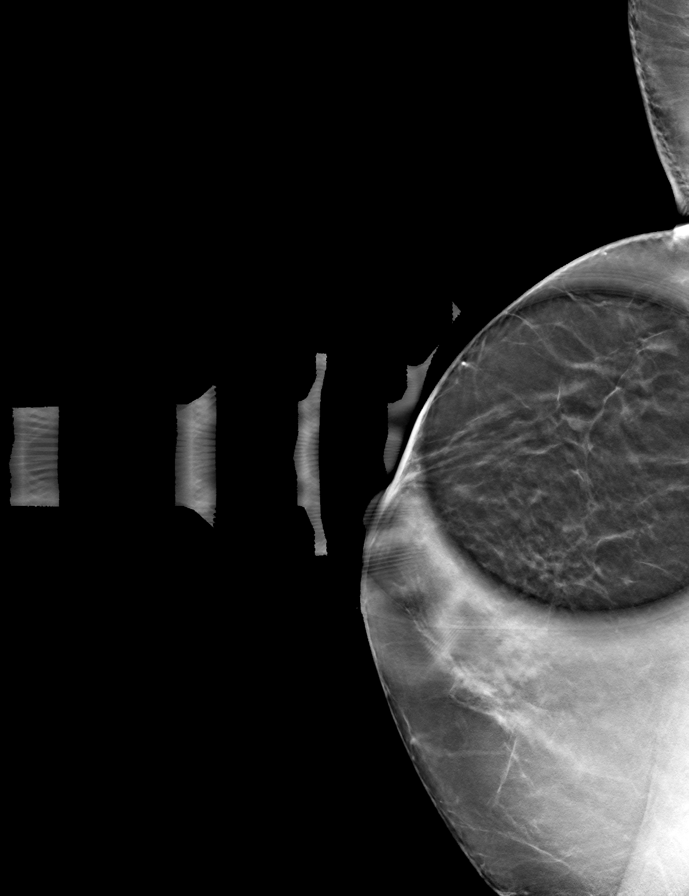

[R ML tomo · tomo slice 29/58.0]
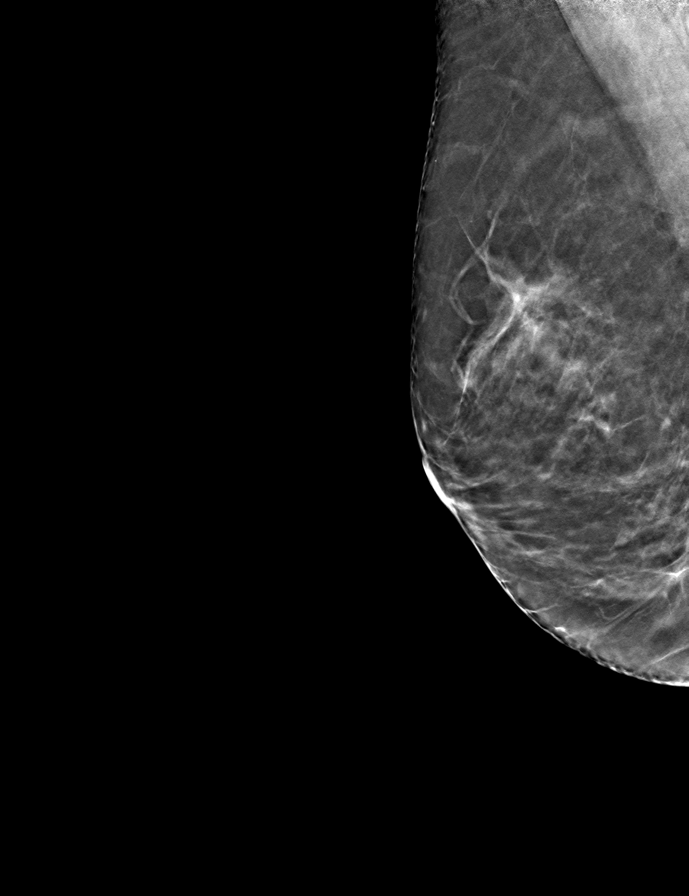

[8 of 24 positions shown; findings below may reference images not displayed]

ACR Breast Density Category b: There are scattered areas of
fibroglandular density.
FINDINGS: Bilateral diagnostic mammogram:

RIGHT breast: On today's additional diagnostic views, including spot
compression with 3D tomosynthesis, multiple partially obscured
masses are confirmed within the upper RIGHT breast, largest
measuring approximately 5 mm greatest dimension.

LEFT breast: On today's additional diagnostic views, including spot
compression with 3D tomosynthesis, an oval circumscribed low-density
mass is confirmed within the lower LEFT breast, 5-6 o'clock axis
region, measuring approximately 5 mm greatest dimension.

RIGHT breast: Targeted ultrasound is performed, showing focal benign
fibrocystic change at the 12 o'clock axis, 3 cm from nipple,
measuring 5 mm, corresponding to the mammographic finding. No
suspicious solid or cystic mass is identified within the upper RIGHT
breast.

LEFT breast: Targeted ultrasound is performed, showing a benign cyst
at the 7 o'clock axis, measuring 5 mm, corresponding to the
mammographic finding.
IMPRESSION: No evidence of malignancy within either breast. Small benign cysts
within each breast, corresponding to the mammographic findings.

Patient may return to routine annual bilateral screening mammogram
schedule.

RECOMMENDATION:
Screening mammogram in one year.(Code:F9-3-WE2)

I have discussed the findings and recommendations with the patient.
If applicable, a reminder letter will be sent to the patient
regarding the next appointment.

BI-RADS CATEGORY  2: Benign.

## 2022-12-08 DIAGNOSIS — N898 Other specified noninflammatory disorders of vagina: Secondary | ICD-10-CM | POA: Diagnosis not present

## 2022-12-08 DIAGNOSIS — Z01419 Encounter for gynecological examination (general) (routine) without abnormal findings: Secondary | ICD-10-CM | POA: Diagnosis not present

## 2023-01-01 DIAGNOSIS — Z1159 Encounter for screening for other viral diseases: Secondary | ICD-10-CM | POA: Diagnosis not present

## 2023-01-01 DIAGNOSIS — Z114 Encounter for screening for human immunodeficiency virus [HIV]: Secondary | ICD-10-CM | POA: Diagnosis not present

## 2023-02-09 DIAGNOSIS — Z1231 Encounter for screening mammogram for malignant neoplasm of breast: Secondary | ICD-10-CM | POA: Diagnosis not present
